# Patient Record
Sex: Female | Born: 1975 | Race: Black or African American | Hispanic: No | Marital: Single | Smoking: Current some day smoker
Health system: Southern US, Community
[De-identification: ages and names within clinical notes are randomized; demographics above are authoritative.]

## PROBLEM LIST (undated history)

## (undated) HISTORY — PX: TUBAL LIGATION: SHX77

---

## 2004-03-17 ENCOUNTER — Ambulatory Visit (HOSPITAL_COMMUNITY): Admission: RE | Admit: 2004-03-17 | Discharge: 2004-03-17 | Payer: Self-pay | Admitting: *Deleted

## 2004-11-20 IMAGING — US US OB COMP +14 WK
1 series · 13 of 28 positions shown · non-contrast
Comparison: none

CLINICAL DATA: Uncertain menstrual dates.  Evaluate gestational age and anatomy.

[Series 1: unknown · 0.30mm/px · 13 of 56 slices shown]
[im 3/56]
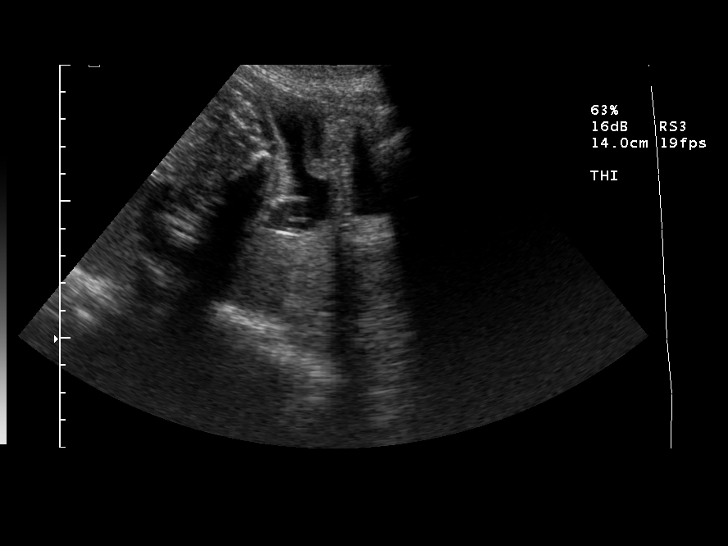
[im 7/56]
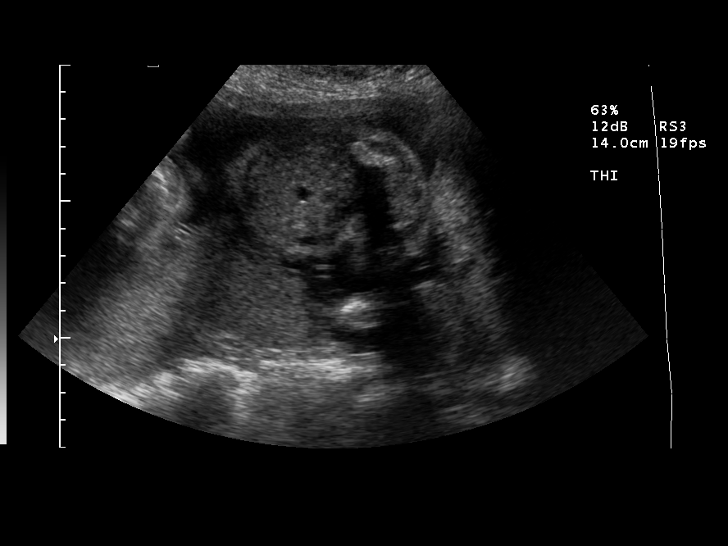
[im 11/56]
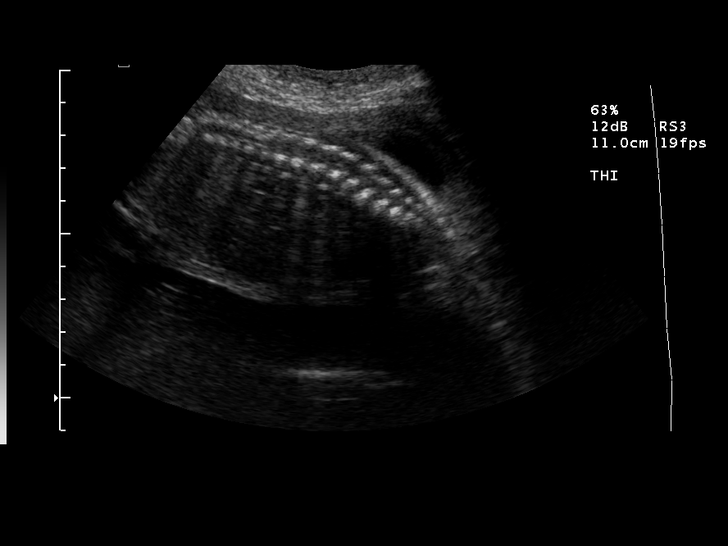
[im 15/56]
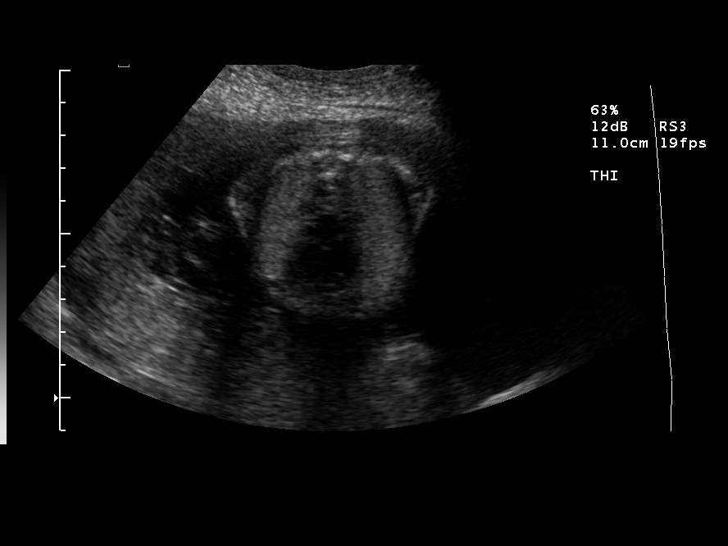
[im 19/56]
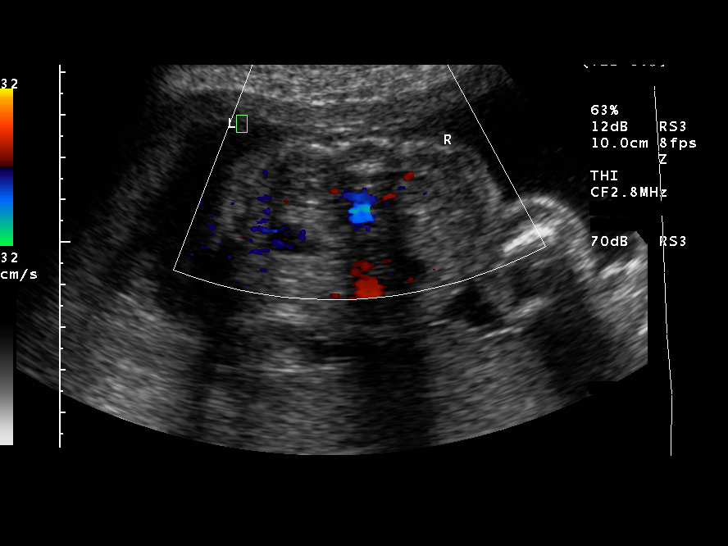
[im 23/56]
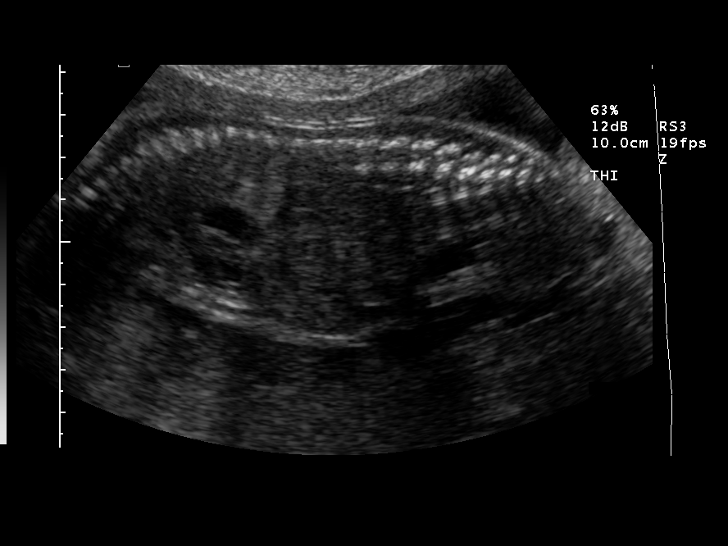
[im 29/56]
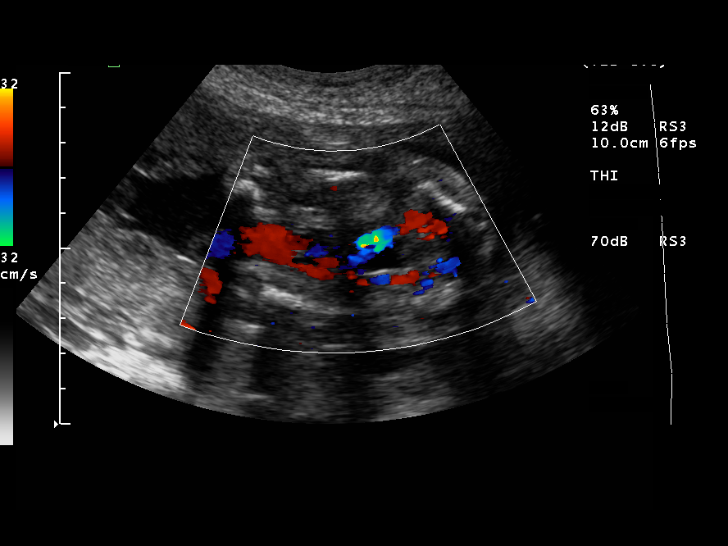
[im 33/56]
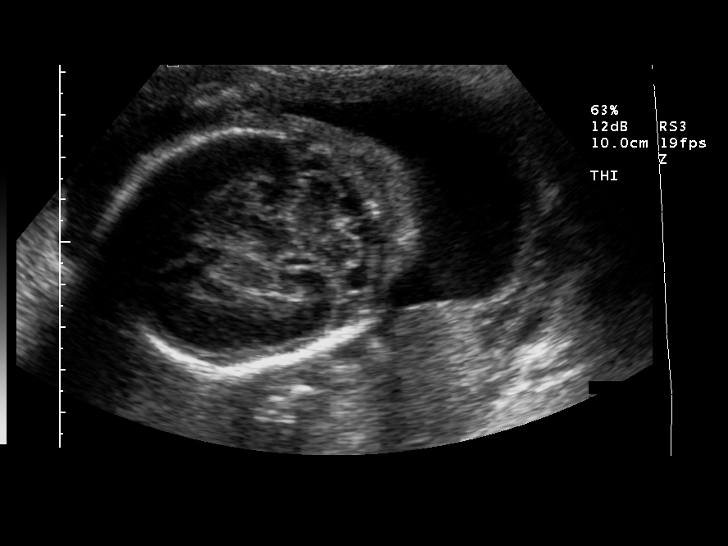
[im 37/56]
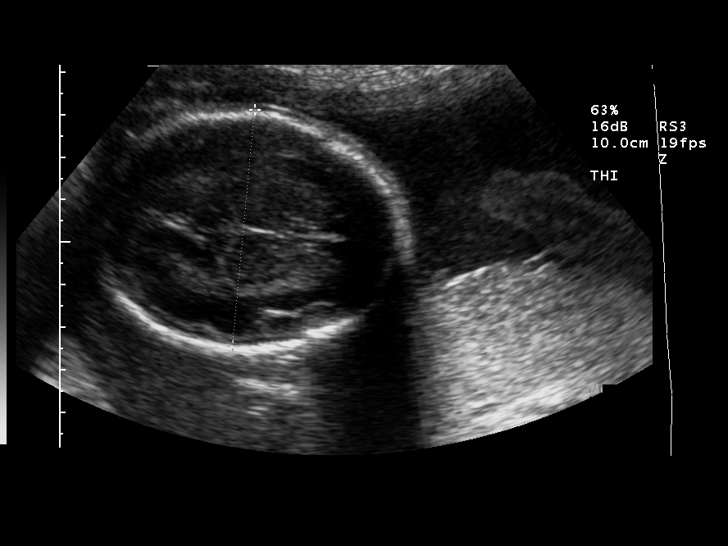
[im 41/56]
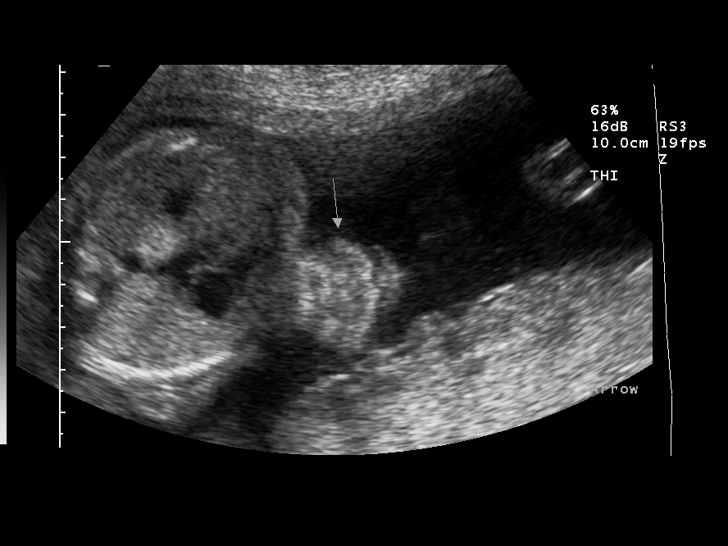
[im 45/56]
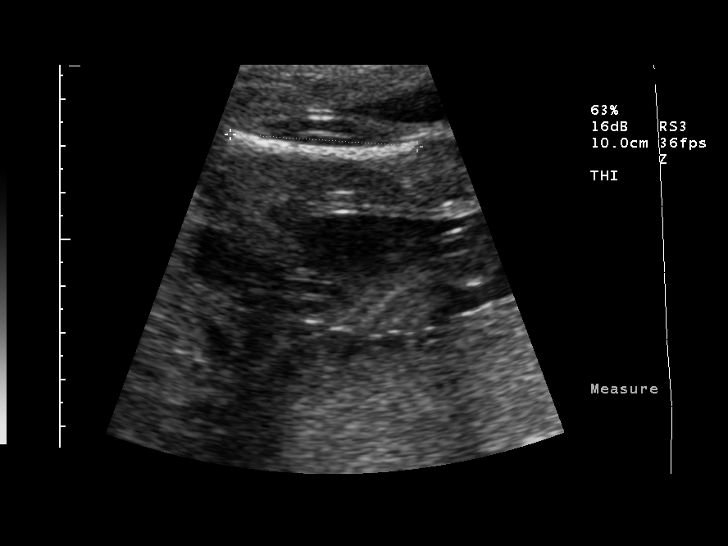
[im 49/56]
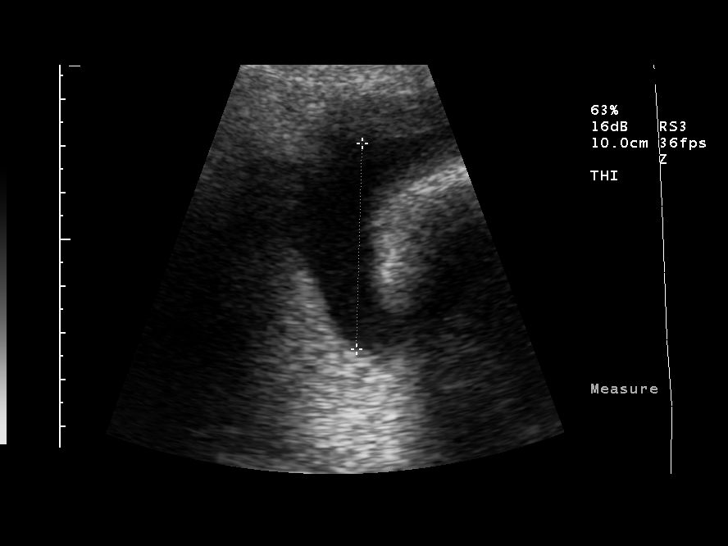
[im 53/56]
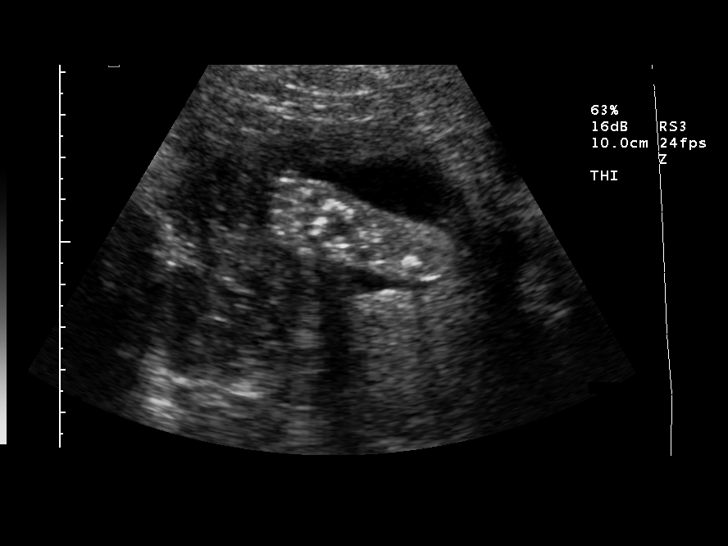

[13 of 28 positions shown; findings below may reference images not displayed]

OBSTETRICAL ULTRASOUND
 Number of Fetuses:  1
 Heart Rate:  162
 Movement:  Yes
 Breathing:  Yes  
 Presentation:  Breech
 Placental Location:  Posterior
 Grade:  I
 Previa:  No
 Amniotic Fluid (Subjective):  Normal
 Amniotic Fluid (Objective):   4.4 cm Vertical pocket 

 FETAL BIOMETRY
 BPD:   5.6 cm   23 w 0 d
 HC:   21.1 cm  23 w 1 d
 AC:   19.5 cm   24 w 1 d
 FL:    4.1 cm  23 w 2 d

 MEAN GA:  23 w 3 d

 FETAL ANATOMY
 Lateral Ventricles:    Visualized 
 Thalami/CSP:      Visualized 
 Posterior Fossa:  Visualized 
 Nuchal Region:    Visualized 
 Spine:      Visualized 
 4 Chamber Heart on Left:      Visualized 
 Stomach on Left:      Visualized 
 3 Vessel Cord:    Visualized 
 Cord Insertion site:    Visualized 
 Kidneys:  Visualized 
 Bladder:  Visualized 
 Extremities:      Visualized 

 ADDITIONAL ANATOMY VISUALIZED:  LVOT, upper lip, orbits, diaphragm, heel, 5th digit, aortic arch, and female genitalia

 MATERNAL FINDINGS  
 Cervix: 4.1 cm Transabdominally
IMPRESSION: Single living intrauterine fetus with mean gestational age of 23 weeks 3 days and sonographic EDC of 07/11/04.  
 No evidence of fetal anatomic abnormality.

## 2007-03-22 ENCOUNTER — Emergency Department (HOSPITAL_COMMUNITY): Admission: EM | Admit: 2007-03-22 | Discharge: 2007-03-22 | Payer: Self-pay | Admitting: Emergency Medicine

## 2018-01-15 ENCOUNTER — Other Ambulatory Visit: Payer: Self-pay

## 2018-01-15 ENCOUNTER — Emergency Department (HOSPITAL_BASED_OUTPATIENT_CLINIC_OR_DEPARTMENT_OTHER)
Admission: EM | Admit: 2018-01-15 | Discharge: 2018-01-15 | Discharge status: Against Medical Advice | Payer: Self-pay | Attending: Emergency Medicine | Admitting: Emergency Medicine

## 2018-01-15 ENCOUNTER — Encounter (HOSPITAL_BASED_OUTPATIENT_CLINIC_OR_DEPARTMENT_OTHER): Payer: Self-pay

## 2018-01-15 DIAGNOSIS — Z5321 Procedure and treatment not carried out due to patient leaving prior to being seen by health care provider: Secondary | ICD-10-CM | POA: Insufficient documentation

## 2018-01-15 DIAGNOSIS — R0602 Shortness of breath: Secondary | ICD-10-CM | POA: Insufficient documentation

## 2018-01-15 NOTE — ED Notes (Signed)
Called x 2 to be seen , no answer, not in lobby

## 2018-01-15 NOTE — ED Notes (Signed)
Called x 3, pt left department without being seen.

## 2018-01-15 NOTE — ED Notes (Signed)
Vitals actually done by myself, charted on wrong comuter

## 2018-01-15 NOTE — ED Triage Notes (Signed)
C/o flu like sx x 1week-[facility] pt-RT was in triage to see pt

## 2018-01-17 NOTE — ED Notes (Signed)
01/17/2018, attempted follow-up call, no answer
